# Patient Record
Sex: Male | Born: 2000
Health system: Southern US, Community
[De-identification: ages and names within clinical notes are randomized; demographics above are authoritative.]

## PROBLEM LIST (undated history)

## (undated) DIAGNOSIS — J302 Other seasonal allergic rhinitis: Secondary | ICD-10-CM

## (undated) DIAGNOSIS — F411 Generalized anxiety disorder: Secondary | ICD-10-CM

## (undated) DIAGNOSIS — H9325 Central auditory processing disorder: Secondary | ICD-10-CM

## (undated) DIAGNOSIS — J45909 Unspecified asthma, uncomplicated: Secondary | ICD-10-CM

## (undated) HISTORY — DX: Unspecified asthma, uncomplicated: J45.909

---
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
## 2021-02-28 ENCOUNTER — Other Ambulatory Visit: Payer: Self-pay | Admitting: Sports Medicine

## 2021-02-28 ENCOUNTER — Encounter: Payer: Self-pay | Admitting: Sports Medicine

## 2021-02-28 ENCOUNTER — Other Ambulatory Visit (HOSPITAL_BASED_OUTPATIENT_CLINIC_OR_DEPARTMENT_OTHER): Payer: Self-pay

## 2021-02-28 DIAGNOSIS — F411 Generalized anxiety disorder: Secondary | ICD-10-CM

## 2021-02-28 MED ORDER — ESCITALOPRAM OXALATE 20 MG PO TABS
20.0000 mg | ORAL_TABLET | Freq: Every day | ORAL | 3 refills | Status: DC
  Filled 2021-02-28: qty 90, 90d supply, fill #0
  Filled 2021-06-20: qty 90, 90d supply, fill #1

## 2021-03-04 ENCOUNTER — Other Ambulatory Visit (HOSPITAL_BASED_OUTPATIENT_CLINIC_OR_DEPARTMENT_OTHER): Payer: Self-pay

## 2021-05-27 DIAGNOSIS — H52223 Regular astigmatism, bilateral: Secondary | ICD-10-CM | POA: Diagnosis not present

## 2021-05-27 DIAGNOSIS — H5213 Myopia, bilateral: Secondary | ICD-10-CM | POA: Diagnosis not present

## 2021-05-27 DIAGNOSIS — Z135 Encounter for screening for eye and ear disorders: Secondary | ICD-10-CM | POA: Diagnosis not present

## 2021-05-27 DIAGNOSIS — H1045 Other chronic allergic conjunctivitis: Secondary | ICD-10-CM | POA: Diagnosis not present

## 2021-06-20 ENCOUNTER — Other Ambulatory Visit (HOSPITAL_BASED_OUTPATIENT_CLINIC_OR_DEPARTMENT_OTHER): Payer: Self-pay

## 2021-06-24 ENCOUNTER — Ambulatory Visit: Payer: 59 | Admitting: Family Medicine

## 2021-06-24 ENCOUNTER — Encounter: Payer: Self-pay | Admitting: Family Medicine

## 2021-06-24 VITALS — BP 127/76 | HR 73 | Temp 98.2°F | Ht 70.0 in | Wt 173.2 lb

## 2021-06-24 DIAGNOSIS — Z7689 Persons encountering health services in other specified circumstances: Secondary | ICD-10-CM | POA: Diagnosis not present

## 2021-06-24 DIAGNOSIS — F411 Generalized anxiety disorder: Secondary | ICD-10-CM

## 2021-06-24 DIAGNOSIS — F502 Bulimia nervosa, unspecified: Secondary | ICD-10-CM

## 2021-06-24 MED ORDER — ESCITALOPRAM OXALATE 20 MG PO TABS: 10.0000 mg | ORAL_TABLET | Freq: Every day | ORAL | 3 refills | Status: DC

## 2021-06-24 MED ORDER — FLUOXETINE HCL 10 MG PO CAPS: 10.0000 mg | ORAL_CAPSULE | Freq: Every day | ORAL | 1 refills | Status: DC

## 2021-06-24 NOTE — Progress Notes (Signed)
? ?Subjective: ?[id number] care, anxiety and eating disorder ?HPI: ?Charles Rios is a 21 y.o. male presenting to clinic today for: ? ?1.  Anxiety disorder with associated bulimia, purging type ?Patient reports that he started having purging behaviors when he was in the ninth grade.  He had some anxiety but really did not understand that the condition he was suffering from.  Purging behaviors were very rare in the ninth grade but in the 10th grade he went through a very bad break-up and he noticed that those behaviors started becoming more frequent in fact he was anorexic for about 2 weeks during that time.  He recalls having been "grounded" because he had these behaviors and this caused him to feel excessive guilt and embarrassment.  In fact, he admits he is embarrassed to talk about this today but knows that he needs to seek help. ? ?He often finds himself purging after "unhealthy foods".  He identifies these as things like [employer] or other fast foods.  He feels restricted from hanging out with his friends and going out and doing normal things that people his age do because of the constant need to purge.  He has been purging on a daily basis for about 1-1/2 years now.  He has mentioned this previously to his previous PCP and had labs drawn last November but everything was normal.  He has not really felt comfortable talking about it much to anyone else.  He is very interested in seeking counseling services and would like to get that arranged today if possible.  He will be attending school in [place] and ideally would like to have a specialist down there if possible.  He has been on Lexapro for a while and has tried to self taper but notes that he had some adverse side effects of increased anxiety so he went back on the full dose of Lexapro.  He is very open to alternative treatment plan however. ? ?Past Medical History:  ?Diagnosis Date  ? Central auditory processing disorder (CAPD)   ? Generalized  anxiety disorder   ? Seasonal allergies   ? ?History reviewed. No pertinent surgical history. ?Social History  ? ?Socioeconomic History  ? Marital status: Single  ?  Spouse name: Not on file  ? Number of children: Not on file  ? Years of education: Not on file  ? Highest education level: Not on file  ?Occupational History  ? Not on file  ?Tobacco Use  ? Smoking status: Never  ? Smokeless tobacco: Never  ?Vaping Use  ? Vaping Use: Never used  ?Substance and Sexual Activity  ? Alcohol use: No  ? Drug use: No  ? Sexual activity: Never  ?Other Topics Concern  ? Not on file  ?Social History Narrative  ? Not on file  ? ?Social Determinants of Health  ? ?Financial Resource Strain: Not on file  ?Food Insecurity: Not on file  ?Transportation Needs: Not on file  ?Physical Activity: Not on file  ?Stress: Not on file  ?Social Connections: Not on file  ?Intimate Partner Violence: Not on file  ? ?Current Meds  ?Medication Sig  ? FLUoxetine (PROZAC) 10 MG capsule Take 1 capsule (10 mg total) by mouth daily.  ? [DISCONTINUED] cloNIDine (CATAPRES) 0.1 MG tablet Take 1 tablet (0.1 mg total) by mouth 2 (two) times daily.  ? [DISCONTINUED] escitalopram (LEXAPRO) 20 MG tablet Take 1 tablet (20 mg total) by mouth daily.  ? [DISCONTINUED] meloxicam (MOBIC) 15 MG tablet One tab PO  qAM with a meal for 2 weeks, then daily prn pain.  ? ?Family History  ?Problem Relation Age of Onset  ? Healthy Mother   ? Healthy Father   ? ?Allergies  ?Allergen Reactions  ? Other Anaphylaxis  ?  Tree Nut allergy  ? Peanuts [Peanut Oil]   ?  Per mom pt has a  nut allergy   ? ?ROS: Per HPI ? ?Objective: ?Office vital signs reviewed. ?BP 127/76   Pulse 73   Temp 98.2 ?F (36.8 ?C)   Ht '5\' 10"'$  (1.778 m)   Wt 173 lb 3.2 oz (78.6 kg)   SpO2 97%   BMI 24.85 kg/m?  ? ?Physical Examination:  ?General: Awake, alert, well-appearing male, No acute distress ?HEENT: Sclera white.  Moist mucous membranes ?Cardio: regular rate and rhythm  ?Pulm: Normal work of breathing  on room air ?Psych: Mood is stable.  Patient is slightly anxious when discussing his disorder but very pleasant, interactive.  His thought process is linear.  Good eye insight.  Good eye contact. ? ? ?  06/24/2021  ? 11:06 AM 08/07/2019  ?  9:10 AM 04/13/2018  ?  9:49 AM  ?Depression screen PHQ 2/9  ?Decreased Interest 0 3 1  ?Down, Depressed, Hopeless '1 1 1  '$ ?PHQ - 2 Score '1 4 2  '$ ?Altered sleeping '2 2 2  '$ ?Tired, decreased energy 1 1 0  ?Change in appetite '3 1 1  '$ ?Feeling bad or failure about yourself  0 2 1  ?Trouble concentrating 0 3 0  ?Moving slowly or fidgety/restless 1 0 0  ?Suicidal thoughts 0 0 0  ?PHQ-9 Score '8 13 6  '$ ?Difficult doing work/chores Not difficult at all Very difficult Somewhat difficult  ? ? ?  06/24/2021  ? 11:06 AM 08/07/2019  ?  9:12 AM 04/13/2018  ?  9:50 AM 02/28/2018  ?  9:00 AM  ?GAD 7 : Generalized Anxiety Score  ?Nervous, Anxious, on Edge '1 3 1 1  '$ ?Control/stop worrying '2 3 2 3  '$ ?Worry too much - different things '1 2 1 3  '$ ?Trouble relaxing 0 1 0 2  ?Restless '1 2 1 1  '$ ?Easily annoyed or irritable '1 2 1 1  '$ ?Afraid - awful might happen 0 '1 1 1  '$ ?Total GAD 7 Score '6 14 7 12  '$ ?Anxiety Difficulty Somewhat difficult Very difficult Not difficult at all Very difficult  ? ? ?Assessment/ Plan: ?21 y.o. male  ? ?Bulimia nervosa, purging type - Plan: FLUoxetine (PROZAC) 10 MG capsule, Ambulatory referral to Psychology ? ?Establishing care with new doctor, encounter for ? ?Generalized anxiety disorder - Plan: escitalopram (LEXAPRO) 20 MG tablet, Ambulatory referral to Psychology ? ?He certainly sounds like he is suffering from bulimia nerve use and this seems to be related to may be an uncontrolled generalized anxiety disorder.  Since Lexapro has really not been efficacious for him I am going to taper him off of this and put him on Prozac instead.  He will go down to 10 mg of Lexapro daily for the next 2 weeks and cross taper with Prozac 10 mg daily for the next 2 weeks.  We have set up a telephone visit to  reconvene in 2 weeks and at that time I will advance to 20 mg daily depending on tolerance of the Prozac.  I certainly considered addition of Abilify as well.  I am referring to counseling services in [place] and less we can find somebody that is virtual in [place]  so that he can get started sooner.  May need to consider addition of referral to psychiatry if we are not successful with our treatment plan. ? ?Total time spent with patient 32 minutes.  Greater than 50% of encounter spent in coordination of care/counseling. ? ? ?Janora Norlander, DO ?[place] ?([phone] ? ? ? ?

## 2021-06-24 NOTE — Patient Instructions (Addendum)
Cut Lexapro in half for 14 days.  Then STOP medication. ?Immediately START Fluoxetine '10mg'$  daily ?You will be called to schedule counselor ? ?Taking the medicine as directed and not missing any doses is one of the best things you can do to treat your condition.  Here are some things to keep in mind: ? ?Side effects (stomach upset, some increased anxiety) may happen before you notice a benefit.  These side effects typically go away over time. ?Changes to your dose of medicine or a change in medication all together is sometimes necessary ?Most people need to be on medication at least 12 months ?Many people will notice an improvement within two weeks but the full effect of the medication can take up to 4-6 weeks ?Stopping the medication when you start feeling better often results in a return of symptoms ?Never discontinue your medication without contacting a health care professional first.  Some medications require gradual discontinuation/ taper and can make you sick if you stop them abruptly. ? ?If your symptoms worsen or you have thoughts of suicide/homicide, PLEASE SEEK IMMEDIATE MEDICAL ATTENTION.  You may always call: ? ?National Suicide Hotline: (503) 688-4622 ?[place]: [phone] ?[facility] in [place]: [phone] ? ? ?These are available 24 hours a day, 7 days a week. ? ? ?

## 2021-07-08 ENCOUNTER — Telehealth (INDEPENDENT_AMBULATORY_CARE_PROVIDER_SITE_OTHER): Payer: 59 | Admitting: Family Medicine

## 2021-07-08 DIAGNOSIS — F502 Bulimia nervosa: Secondary | ICD-10-CM

## 2021-07-08 MED ORDER — FLUOXETINE HCL 20 MG PO CAPS
20.0000 mg | ORAL_CAPSULE | Freq: Every day | ORAL | 0 refills | Status: DC
Start: 2021-07-08 — End: 2021-08-26

## 2021-07-08 NOTE — Progress Notes (Signed)
MyChart Video visit  Subjective: [name]  PCP: Janora Norlander, DO [name] Charles Rios is a 21 y.o. male. Patient provides verbal consent for consult held via video.  Due to COVID-19 pandemic this visit was conducted virtually. This visit type was conducted due to national recommendations for restrictions regarding the COVID-19 Pandemic (e.g. social distancing, sheltering in place) in an effort to limit this patient's exposure and mitigate transmission in our community. All issues noted in this document were discussed and addressed.  A physical exam was not performed with this format.   Location of patient: work Location of provider: [facility] Others present for call: none  1.  Bulimia, purging type Patient was seen about 2 weeks ago.  At that time his Lexapro was reduced to 10 mg and he was started on 10 mg of Prozac.  Patient has felt like something is changing. He has not noticed necessarily any reduction in frequency of behaviors but does feel more rational when purging urges hit.  He feels optimistic about how things are going with the new medication and does not report any concerning side effects or symptoms.  ROS: Per HPI  Allergies  Allergen Reactions   Other Anaphylaxis    Tree Nut allergy   Peanuts [Peanut Oil]     Per mom pt has a  nut allergy    Past Medical History:  Diagnosis Date   Central auditory processing disorder (CAPD)    Generalized anxiety disorder    Seasonal allergies     Current Outpatient Medications:    escitalopram (LEXAPRO) 20 MG tablet, Take 0.5 tablets (10 mg total) by mouth daily., Disp: 90 tablet, Rfl: 3   FLUoxetine (PROZAC) 10 MG capsule, Take 1 capsule (10 mg total) by mouth daily., Disp: 30 capsule, Rfl: 1  Gen: well appearing male, NAD Psych: good eye contact. Mood stable.  Assessment/ Plan: 21 y.o. male   Bulimia nervosa, purging type - Plan: FLUoxetine (PROZAC) 20 MG capsule  Seems to be doing slightly better though continues to  have purging behaviors.  Given how early it is in the process of medication changes I am optimistic about how he will in fact respond to therapeutic doses of Prozac.  I have gone ahead and advanced him to 20 mg and he will totally discontinue Lexapro.  We discussed contacting me if any concerning symptoms or side effects occur prior to our next visit which is scheduled for July 18.  He has no questions or concerns at this time and feels positive about the current plan.  Start time: 11:03am End time: 11:09a  Total time spent on patient care (including video visit/ documentation): 6 minutes  [name], [place] [phone]

## 2021-08-26 ENCOUNTER — Ambulatory Visit: Payer: 59 | Admitting: Family Medicine

## 2021-08-26 ENCOUNTER — Encounter: Payer: Self-pay | Admitting: Family Medicine

## 2021-08-26 VITALS — BP 135/84 | HR 85 | Temp 97.3°F | Ht 70.0 in | Wt 167.2 lb

## 2021-08-26 DIAGNOSIS — F411 Generalized anxiety disorder: Secondary | ICD-10-CM

## 2021-08-26 DIAGNOSIS — F502 Bulimia nervosa: Secondary | ICD-10-CM

## 2021-08-26 MED ORDER — FLUOXETINE HCL 40 MG PO CAPS: 40.0000 mg | ORAL_CAPSULE | Freq: Every day | ORAL | 0 refills | Status: DC

## 2021-08-26 NOTE — Progress Notes (Signed)
Subjective: CC: Follow-up bulimia, anxiety PCP: Janora Norlander, DO SFK:Charles Rios is a 21 y.o. male presenting to clinic today for:  1.  Bulimia, anxiety Patient reports overall anxiety seems to be better but the bulimia is still at the same.  He still is triggered by "foods that are not good for him" needing to get out of his body.  He just wants to enjoy a hamburger without feel like he needs to regurgitate it.  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  He is going to have an apartment off campus.  He is currently in the process of freshening that.  School does not start though until mid August.   ROS: Per HPI  Allergies  Allergen Reactions   Other Anaphylaxis    Tree Nut allergy   Peanuts [Peanut Oil]     Per mom pt has a  nut allergy    Past Medical History:  Diagnosis Date   Central auditory processing disorder (CAPD)    Generalized anxiety disorder    Seasonal allergies     Current Outpatient Medications:    FLUoxetine (PROZAC) 20 MG capsule, Take 1 capsule (20 mg total) by mouth daily., Disp: 90 capsule, Rfl: 0 Social History   Socioeconomic History   Marital status: Single    Spouse name: Not on file   Number of children: Not on file   Years of education: Not on file   Highest education level: Not on file  Occupational History   Not on file  Tobacco Use   Smoking status: Never   Smokeless tobacco: Never  Vaping Use   Vaping Use: Never used  Substance and Sexual Activity   Alcohol use: No   Drug use: No   Sexual activity: Never  Other Topics Concern   Not on file  Social History Narrative   Enrolled at [employer]   Father works for [employer] in [place], [place]   Social Determinants of Health   Financial Resource Strain: Not on [employer] Insecurity: Not on file  Transportation Needs: Not on file  Physical Activity: Not on file  Stress: Not on file  Social  Connections: Not on file  Intimate Partner Violence: Not on file   Family History  Problem Relation Age of Onset   Healthy Mother    Healthy Father     Objective: Office vital signs reviewed. BP 135/84   Pulse 85   Temp (!) 97.3 F (36.3 C)   Ht '5\' 10"'$  (1.778 m)   Wt 167 lb 3.2 oz (75.8 kg)   SpO2 99%   BMI 23.99 kg/m   Physical Examination:  General: Awake, alert, well nourished, No acute distress HEENT:sclera white, MMM Cardio: regular rate and rhythm, S1S2 heard, no murmurs appreciated Pulm: clear to auscultation bilaterally, no wheezes, rhonchi or rales; normal work of breathing on room air Psych: Mood stable, speech normal, affect appropriate.  Very pleasant, interactive.  Good eye contact     08/26/2021   10:04 AM 06/24/2021   11:06 AM 08/07/2019    9:10 AM  Depression screen PHQ 2/9  Decreased Interest 0 0 3  Down, Depressed, Hopeless '1 1 1  '$ PHQ - 2 Score '1 1 4  '$ Altered sleeping '1 2 2  '$ Tired, decreased energy '1 1 1  '$ Change in appetite '2 3 1  '$ Feeling bad or failure about yourself  0 0 2  Trouble concentrating 2 0 3  Moving slowly or fidgety/restless 2 1 0  Suicidal thoughts 0 0 0  PHQ-9 Score '9 8 13  '$ Difficult doing work/chores Somewhat difficult Not difficult at all Very difficult      08/26/2021   10:04 AM 06/24/2021   11:06 AM 08/07/2019    9:12 AM 04/13/2018    9:50 AM  GAD 7 : Generalized Anxiety Score  Nervous, Anxious, on Edge '2 1 3 1  '$ Control/stop worrying '1 2 3 2  '$ Worry too much - different things '1 1 2 1  '$ Trouble relaxing 0 0 1 0  Restless '3 1 2 1  '$ Easily annoyed or irritable '1 1 2 1  '$ Afraid - awful might happen 0 0 1 1  Total GAD 7 Score '8 6 14 7  '$ Anxiety Difficulty Somewhat difficult Somewhat difficult Very difficult Not difficult at all    Assessment/ Plan: 21 y.o. male   Bulimia nervosa, purging type - Plan: FLUoxetine (PROZAC) 40 MG capsule  Generalized anxiety disorder  Has had about a 6 pound weight loss since her last visit.   Subjectively anxiety symptoms are improving.  Advance Prozac to 40 mg daily.  He has been given information for the counselor that we referred him to back in May.  Advised him to contact me if he needs any referral for any reason or if the Prozac 40 mg is ineffective.  Set up a video visit for 6 to 8 weeks for recheck  No orders of the defined types were placed in this encounter.  No orders of the defined types were placed in this encounter.    Janora Norlander, DO [place] [phone]

## 2021-09-17 ENCOUNTER — Encounter: Payer: Self-pay | Admitting: Family Medicine

## 2021-10-12 ENCOUNTER — Encounter: Payer: Self-pay | Admitting: Family Medicine

## 2021-10-17 ENCOUNTER — Other Ambulatory Visit: Payer: Self-pay | Admitting: Family Medicine

## 2021-10-17 DIAGNOSIS — F502 Bulimia nervosa: Secondary | ICD-10-CM

## 2021-10-20 ENCOUNTER — Encounter: Payer: Self-pay | Admitting: Family Medicine

## 2021-10-20 ENCOUNTER — Telehealth (INDEPENDENT_AMBULATORY_CARE_PROVIDER_SITE_OTHER): Payer: 59 | Admitting: Family Medicine

## 2021-10-20 DIAGNOSIS — F411 Generalized anxiety disorder: Secondary | ICD-10-CM | POA: Diagnosis not present

## 2021-10-20 DIAGNOSIS — F502 Bulimia nervosa: Secondary | ICD-10-CM | POA: Diagnosis not present

## 2021-10-20 MED ORDER — FLUOXETINE HCL 40 MG PO CAPS: ORAL_CAPSULE | ORAL | 3 refills | Status: DC

## 2021-10-20 NOTE — Progress Notes (Signed)
MyChart Video visit  Subjective: WG:NFAO PCP: Janora Norlander, DO ZHY:QMVHQIO [place] is a 21 y.o. male. Patient provides verbal consent for consult held via video.  Due to COVID-19 pandemic this visit was conducted virtually. This visit type was conducted due to national recommendations for restrictions regarding the COVID-19 Pandemic (e.g. social distancing, sheltering in place) in an effort to limit this patient's exposure and mitigate transmission in our community. All issues noted in this document were discussed and addressed.  A physical exam was not performed with this format.   Location of patient: school in [place] Location of provider: [facility] Others present for call: none  1. Anxiety/ Bulimia  Patient feeling somewhat better on the increased dose.  He has yet to establish care with counseling services.  He's been busy with school. He has been able to control his binging/purging a little better.  He still engages in purging a few times per week but no longer multiple times per day. Anxiety is well controlled.  He feels like he is thriving at school.  Not having any issues from a social standpoint.  Has adjusted to his schedule now.  ROS: Per HPI  Allergies  Allergen Reactions   Other Anaphylaxis    Tree Nut allergy   Peanuts [Peanut Oil]     Per mom pt has a  nut allergy    Past Medical History:  Diagnosis Date   Central auditory processing disorder (CAPD)    Generalized anxiety disorder    Seasonal allergies     Current Outpatient Medications:    FLUoxetine (PROZAC) 40 MG capsule, TAKE 1 CAPSULE(40 MG) BY MOUTH DAILY, Disp: 90 capsule, Rfl: 0  Gen: Well-appearing, well-nourished male Psych: Mood able, speech normal, affect appropriate.  Pleasant, interactive.  Smiling  Assessment/ Plan: 21 y.o. male   Bulimia nervosa, purging type - Plan: FLUoxetine (PROZAC) 40 MG capsule  Generalized anxiety disorder - Plan: FLUoxetine (PROZAC) 40 MG capsule  Bulimia  still present but frequency decreasing.  This is promising.  Still think that adding counseling/therapy is going to be essential in reducing/eliminating these behaviors.  He is in the action phase of getting this scheduled and did not need the number to the office today.  We will continue Prozac at 40 mg daily as his anxiety is under excellent control.  Would like to see him back sometime at the end of December beginning of January for check-in, sooner if concerns arise  Start time: 3:45pm (first text sent); 3:49pm (second text sent) End time: 3:57pm  Total time spent on patient care (including video visit/ documentation): 7 minutes  [place], [place] [phone]

## 2021-11-21 ENCOUNTER — Other Ambulatory Visit (HOSPITAL_BASED_OUTPATIENT_CLINIC_OR_DEPARTMENT_OTHER): Payer: Self-pay

## 2021-11-21 ENCOUNTER — Other Ambulatory Visit: Payer: Self-pay | Admitting: Family Medicine

## 2021-11-21 DIAGNOSIS — F411 Generalized anxiety disorder: Secondary | ICD-10-CM

## 2021-11-21 DIAGNOSIS — F502 Bulimia nervosa: Secondary | ICD-10-CM

## 2021-11-21 MED ORDER — FLUOXETINE HCL 40 MG PO CAPS
40.0000 mg | ORAL_CAPSULE | Freq: Every day | ORAL | 0 refills | Status: DC
  Filled 2021-11-21: qty 60, 60d supply, fill #0

## 2022-02-05 ENCOUNTER — Other Ambulatory Visit: Payer: Self-pay | Admitting: Family Medicine

## 2022-02-05 ENCOUNTER — Other Ambulatory Visit (HOSPITAL_BASED_OUTPATIENT_CLINIC_OR_DEPARTMENT_OTHER): Payer: Self-pay

## 2022-02-05 ENCOUNTER — Encounter: Payer: Self-pay | Admitting: Family Medicine

## 2022-02-05 DIAGNOSIS — F502 Bulimia nervosa: Secondary | ICD-10-CM

## 2022-02-05 DIAGNOSIS — F411 Generalized anxiety disorder: Secondary | ICD-10-CM

## 2022-02-05 MED ORDER — FLUOXETINE HCL 40 MG PO CAPS
40.0000 mg | ORAL_CAPSULE | Freq: Every day | ORAL | 0 refills | Status: DC
  Filled 2022-02-05: qty 90, 90d supply, fill #0

## 2022-03-03 ENCOUNTER — Encounter: Payer: Self-pay | Admitting: Family Medicine

## 2022-03-03 NOTE — Telephone Encounter (Signed)
[place], I have a 315 acute slot open Monday.  If we have a sooner cancellation, I'm glad to accommodate but I think we're double booked already this week.

## 2022-03-09 ENCOUNTER — Telehealth (INDEPENDENT_AMBULATORY_CARE_PROVIDER_SITE_OTHER): Payer: Commercial Managed Care - PPO | Admitting: Family Medicine

## 2022-03-09 ENCOUNTER — Encounter: Payer: Self-pay | Admitting: Family Medicine

## 2022-03-09 DIAGNOSIS — F411 Generalized anxiety disorder: Secondary | ICD-10-CM

## 2022-03-09 DIAGNOSIS — F502 Bulimia nervosa: Secondary | ICD-10-CM

## 2022-03-09 MED ORDER — FLUOXETINE HCL 40 MG PO CAPS: 40.0000 mg | ORAL_CAPSULE | Freq: Every day | ORAL | 3 refills | Status: DC
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
## 2022-03-26 DIAGNOSIS — F5089 Other specified eating disorder: Secondary | ICD-10-CM | POA: Diagnosis not present

## 2022-04-03 IMAGING — DX DG SHOULDER 2+V*R*
3 series · 3 of 3 positions shown · non-contrast
Comparison: None.

CLINICAL DATA: Shoulder pain

EXAM:
RIGHT SHOULDER - 2+ VIEW

[shoulder grashey]
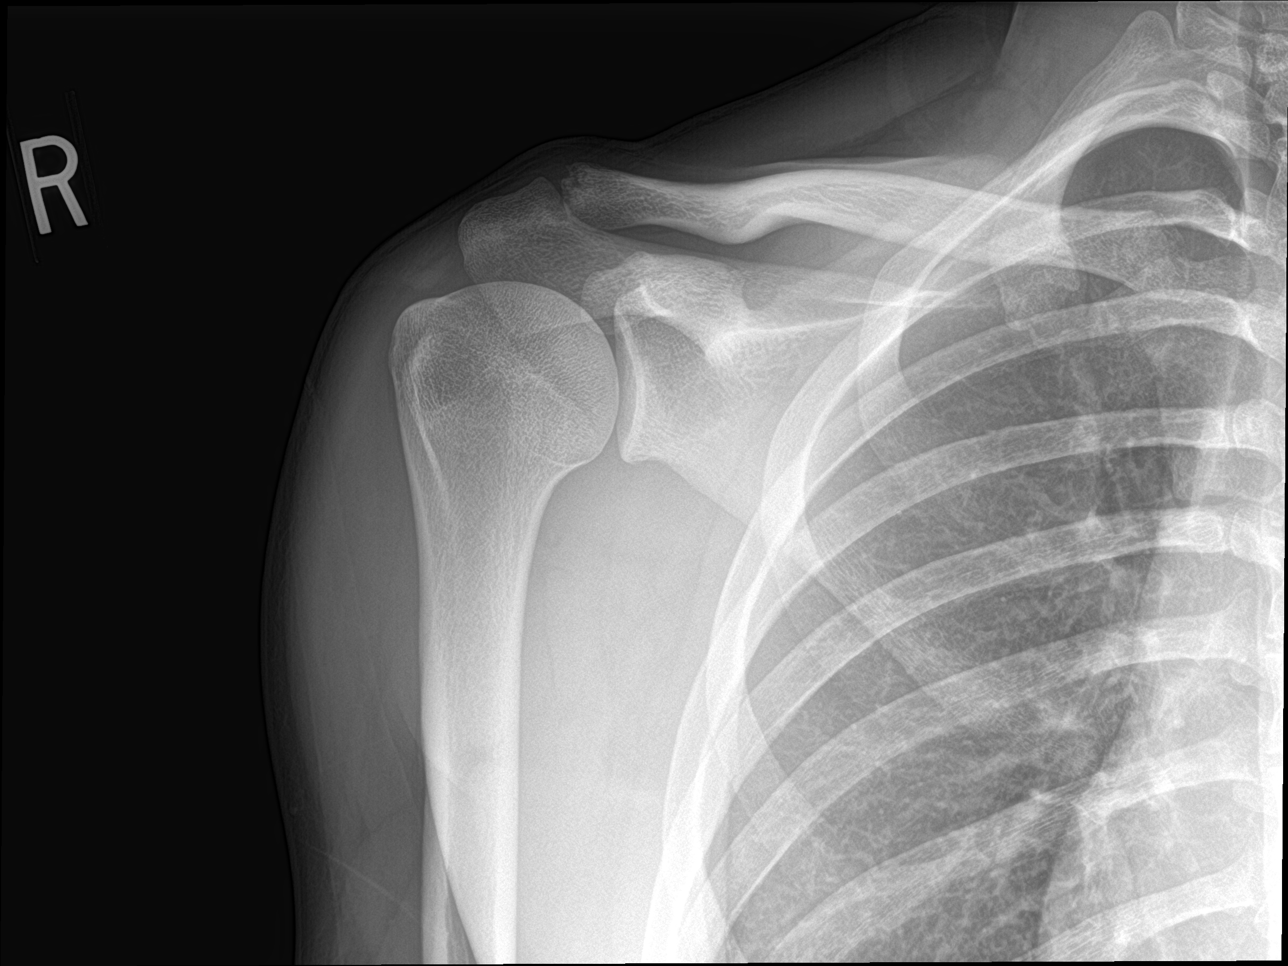

[shoulder y view]
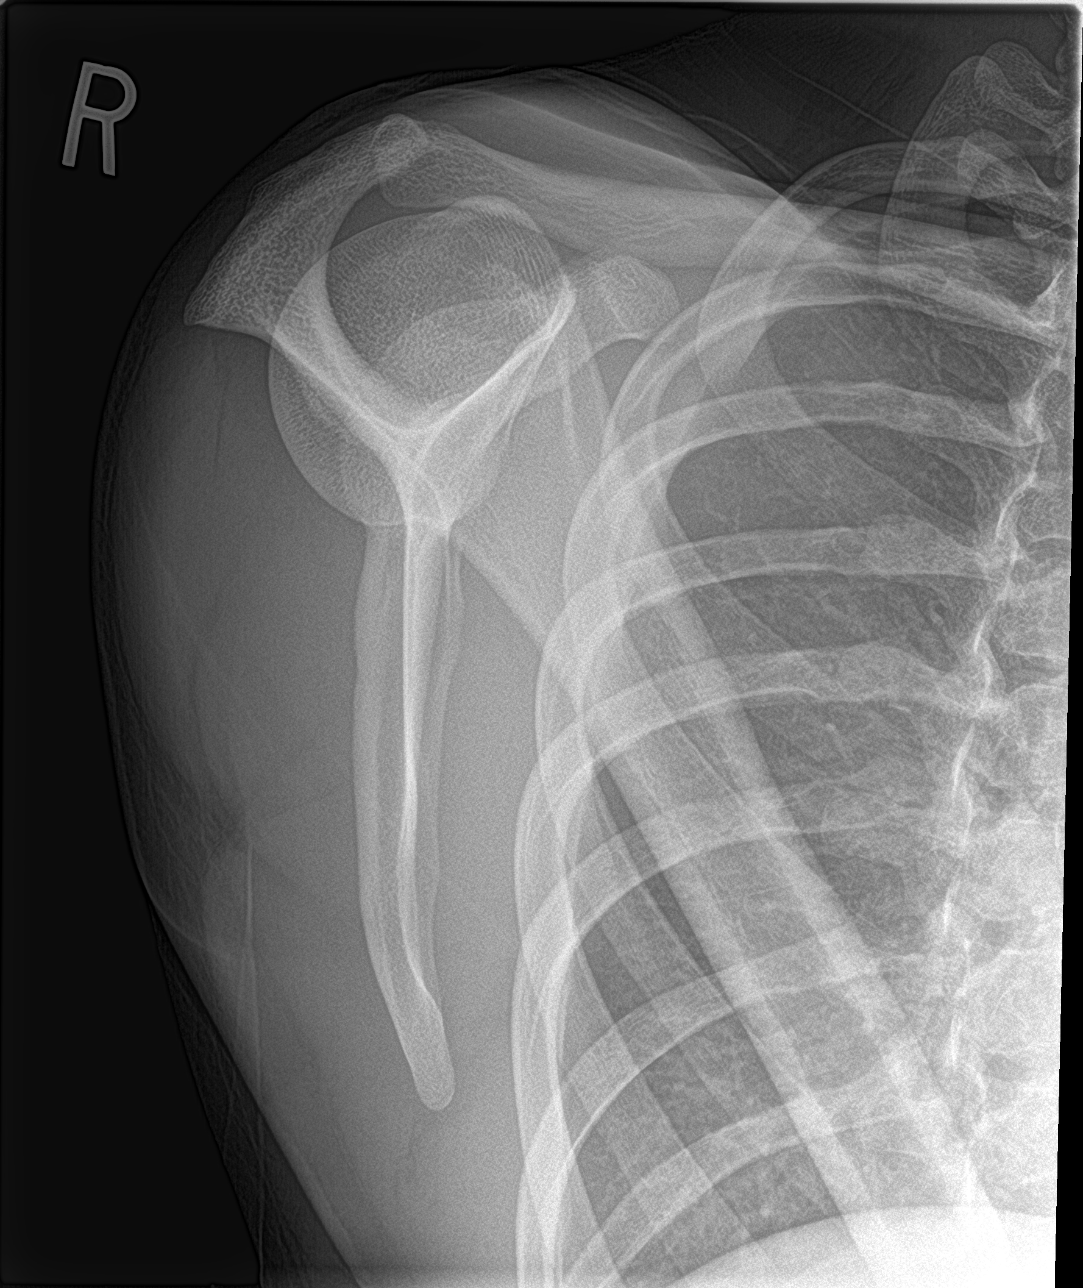

[shoulder axillary]
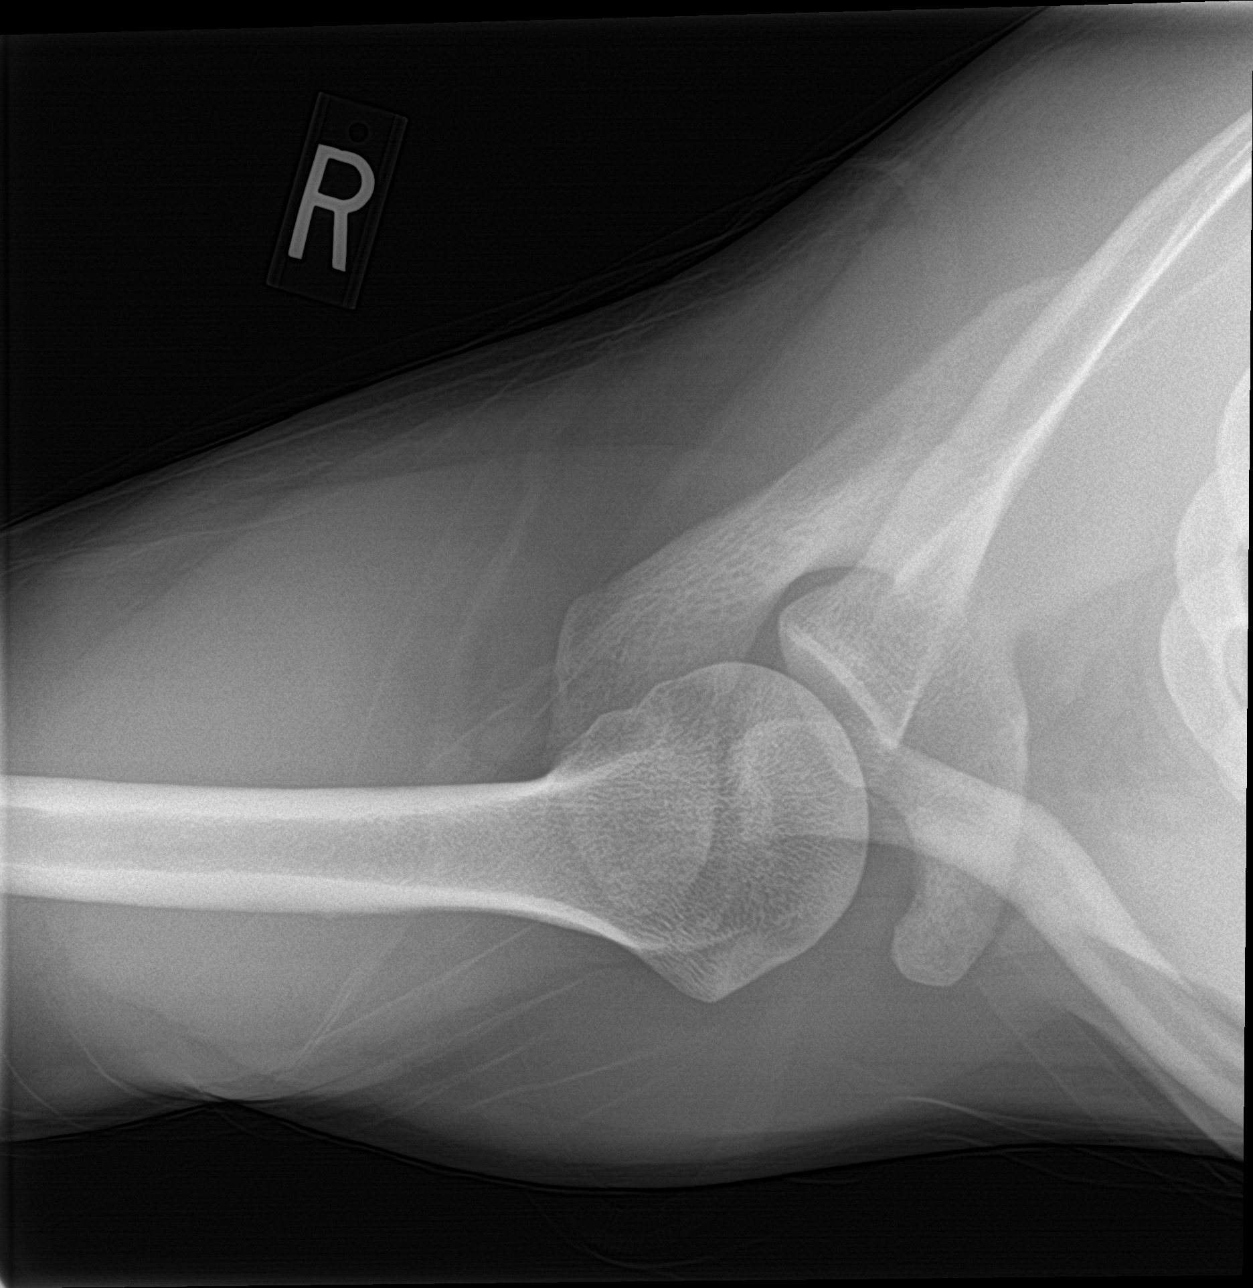

[3 of 3 positions shown; findings below may reference images not displayed]

FINDINGS: No fracture or malalignment. Minimal osteolytic changes at the
articular surface of the distal right clavicle. No significant AC
joint widening. Right lung apex is clear
IMPRESSION: Minimal osteolytic changes at the articular surface of the distal
right clavicle without significant AC joint widening. Otherwise
negative shoulder radiograph

## 2022-04-09 DIAGNOSIS — Z713 Dietary counseling and surveillance: Secondary | ICD-10-CM | POA: Diagnosis not present

## 2022-04-09 DIAGNOSIS — F5089 Other specified eating disorder: Secondary | ICD-10-CM | POA: Diagnosis not present

## 2022-04-10 DIAGNOSIS — F901 Attention-deficit hyperactivity disorder, predominantly hyperactive type: Secondary | ICD-10-CM | POA: Diagnosis not present

## 2022-04-13 DIAGNOSIS — F901 Attention-deficit hyperactivity disorder, predominantly hyperactive type: Secondary | ICD-10-CM | POA: Diagnosis not present

## 2022-04-24 DIAGNOSIS — F901 Attention-deficit hyperactivity disorder, predominantly hyperactive type: Secondary | ICD-10-CM | POA: Diagnosis not present

## 2022-04-28 DIAGNOSIS — Z713 Dietary counseling and surveillance: Secondary | ICD-10-CM | POA: Diagnosis not present

## 2022-04-28 DIAGNOSIS — F5089 Other specified eating disorder: Secondary | ICD-10-CM | POA: Diagnosis not present

## 2022-05-01 DIAGNOSIS — F901 Attention-deficit hyperactivity disorder, predominantly hyperactive type: Secondary | ICD-10-CM | POA: Diagnosis not present

## 2022-05-06 DIAGNOSIS — F5089 Other specified eating disorder: Secondary | ICD-10-CM | POA: Diagnosis not present

## 2022-05-06 DIAGNOSIS — Z713 Dietary counseling and surveillance: Secondary | ICD-10-CM | POA: Diagnosis not present

## 2022-05-12 ENCOUNTER — Encounter: Payer: Self-pay | Admitting: Family Medicine

## 2022-05-20 ENCOUNTER — Other Ambulatory Visit: Payer: Self-pay | Admitting: Family Medicine

## 2022-05-20 DIAGNOSIS — F411 Generalized anxiety disorder: Secondary | ICD-10-CM

## 2022-05-20 DIAGNOSIS — F502 Bulimia nervosa: Secondary | ICD-10-CM

## 2022-05-20 MED ORDER — FLUOXETINE HCL 40 MG PO CAPS: 40.0000 mg | ORAL_CAPSULE | Freq: Every day | ORAL | 3 refills | Status: DC

## 2022-05-21 DIAGNOSIS — F5089 Other specified eating disorder: Secondary | ICD-10-CM | POA: Diagnosis not present

## 2022-05-21 DIAGNOSIS — Z713 Dietary counseling and surveillance: Secondary | ICD-10-CM | POA: Diagnosis not present

## 2022-05-22 DIAGNOSIS — F901 Attention-deficit hyperactivity disorder, predominantly hyperactive type: Secondary | ICD-10-CM | POA: Diagnosis not present

## 2022-06-09 DIAGNOSIS — F901 Attention-deficit hyperactivity disorder, predominantly hyperactive type: Secondary | ICD-10-CM | POA: Diagnosis not present

## 2022-07-22 DIAGNOSIS — F901 Attention-deficit hyperactivity disorder, predominantly hyperactive type: Secondary | ICD-10-CM | POA: Diagnosis not present

## 2022-08-05 DIAGNOSIS — F901 Attention-deficit hyperactivity disorder, predominantly hyperactive type: Secondary | ICD-10-CM | POA: Diagnosis not present

## 2022-08-21 DIAGNOSIS — F901 Attention-deficit hyperactivity disorder, predominantly hyperactive type: Secondary | ICD-10-CM | POA: Diagnosis not present

## 2022-10-09 DIAGNOSIS — F901 Attention-deficit hyperactivity disorder, predominantly hyperactive type: Secondary | ICD-10-CM | POA: Diagnosis not present

## 2022-12-02 DIAGNOSIS — F901 Attention-deficit hyperactivity disorder, predominantly hyperactive type: Secondary | ICD-10-CM | POA: Diagnosis not present

## 2023-01-27 ENCOUNTER — Ambulatory Visit: Payer: Commercial Managed Care - PPO | Admitting: Family Medicine

## 2023-01-27 ENCOUNTER — Encounter: Payer: Self-pay | Admitting: Family Medicine

## 2023-01-27 VITALS — BP 133/75 | HR 78 | Temp 98.1°F | Ht 70.0 in | Wt 177.2 lb

## 2023-01-27 DIAGNOSIS — L600 Ingrowing nail: Secondary | ICD-10-CM

## 2023-01-27 MED ORDER — TRAMADOL HCL 50 MG PO TABS: 50.0000 mg | ORAL_TABLET | Freq: Four times a day (QID) | ORAL | 0 refills | Status: AC

## 2023-01-27 MED ORDER — DICLOFENAC SODIUM 75 MG PO TBEC
75.0000 mg | DELAYED_RELEASE_TABLET | Freq: Two times a day (BID) | ORAL | 0 refills | Status: DC
Start: 2023-01-27 — End: 2023-07-18

## 2023-01-27 MED ORDER — AMOXICILLIN-POT CLAVULANATE 875-125 MG PO TABS: 1.0000 | ORAL_TABLET | Freq: Two times a day (BID) | ORAL | 0 refills | Status: DC

## 2023-01-27 NOTE — Progress Notes (Signed)
Chief Complaint  Patient presents with   Ingrown Toenail    HPI  Patient presents today for painful swelling at the toenail of each great toe since cutting too close a few days ago. Not responding to home therapy, soaking, etc.  PMH: Smoking status noted ROS: Per HPI  Objective: BP 133/75   Pulse 78   Temp 98.1 F (36.7 C)   Ht 5\' 10"  (1.778 m)   Wt 177 lb 3.2 oz (80.4 kg)   SpO2 98%   BMI 25.43 kg/m  Gen: NAD, alert, cooperative with exam HEENT: NCAT Ext: ingrown nail with erythema and edema medially.  Neuro: Alert and oriented, No gross deficits  Assessment and plan:  1. Ingrown toenail     Meds ordered this encounter  Medications   amoxicillin-clavulanate (AUGMENTIN) 875-125 MG tablet    Sig: Take 1 tablet by mouth 2 (two) times daily. Take all of this medication    Dispense:  20 tablet    Refill:  0   traMADol (ULTRAM) 50 MG tablet    Sig: Take 1 tablet (50 mg total) by mouth 4 (four) times daily for 5 days. 1-2 tablets up to 4 times a day as needed for pain    Dispense:  20 tablet    Refill:  0   diclofenac (VOLTAREN) 75 MG EC tablet    Sig: Take 1 tablet (75 mg total) by mouth 2 (two) times daily. For muscle and  Joint pain    Dispense:  60 tablet    Refill:  0   Follow up as needed.  Mechele Claude, MD

## 2023-01-31 ENCOUNTER — Encounter: Payer: Self-pay | Admitting: Family Medicine

## 2023-05-18 ENCOUNTER — Encounter: Payer: Self-pay | Admitting: Family Medicine

## 2023-05-18 DIAGNOSIS — Z91010 Allergy to peanuts: Secondary | ICD-10-CM

## 2023-05-18 DIAGNOSIS — J302 Other seasonal allergic rhinitis: Secondary | ICD-10-CM

## 2023-06-21 ENCOUNTER — Encounter: Payer: Self-pay | Admitting: Allergy & Immunology

## 2023-06-21 ENCOUNTER — Ambulatory Visit: Admitting: Allergy & Immunology

## 2023-06-21 ENCOUNTER — Other Ambulatory Visit: Payer: Self-pay

## 2023-06-21 VITALS — BP 134/88 | HR 88 | Temp 98.1°F | Resp 19 | Ht 69.75 in | Wt 181.7 lb

## 2023-06-21 DIAGNOSIS — T7805XD Anaphylactic reaction due to tree nuts and seeds, subsequent encounter: Secondary | ICD-10-CM

## 2023-06-21 DIAGNOSIS — R053 Chronic cough: Secondary | ICD-10-CM

## 2023-06-21 DIAGNOSIS — J310 Chronic rhinitis: Secondary | ICD-10-CM

## 2023-06-21 NOTE — Patient Instructions (Addendum)
 1. Chronic rhinitis - Because of insurance stipulations, we cannot do skin testing on the same day as your first visit. - We are all working to fight this, but for now we need to do two separate visits.  - We will know more after we do testing at the next visit.  - The skin testing visit can be squeezed in at your convenience.  - Then we can make a more full plan to address all of your symptoms. - Be sure to stop your antihistamines for 3 days before this appointment.  - We will know more at that point once we have the results.  - Start Ryaltris one spray per nostril daily (sample provided).  2. Anaphylaxis due to tree nut - We will test for tree nuts. - We will hold off on EpiPen .  3. Chronic cough - Lung testing looks good today.  - There is no need for an inhaler at this point in time.   4. Return in about 1 week (around 06/28/2023) for SKIN TESTING (1-55 + TREE NUTS). You can have the follow up appointment with Dr. Idolina Maker or a Nurse Practicioner (our Nurse Practitioners are excellent and always have Physician oversight!).    Please inform us  of any Emergency Department visits, hospitalizations, or changes in symptoms. Call us  before going to the ED for breathing or allergy symptoms since we might be able to fit you in for a sick visit. Feel free to contact us  anytime with any questions, problems, or concerns.  It was a pleasure to meet you today!  Websites that have reliable patient information: 1. American Academy of Asthma, Allergy, and Immunology: www.aaaai.org 2. Food Allergy Research and Education (FARE): foodallergy.org 3. Mothers of Asthmatics: http://www.asthmacommunitynetwork.org 4. American College of Allergy, Asthma, and Immunology: www.acaai.org      "Like" us  on Facebook and Instagram for our latest updates!      A healthy democracy works best when [employer] participate! Make sure you are registered to vote! If you have moved or changed any of your contact  information, you will need to get this updated before voting! Scan the QR codes below to learn more!

## 2023-06-21 NOTE — Progress Notes (Signed)
 NEW PATIENT  Date of Service/Encounter:  06/21/23  Consult requested by: Charles Guerin, DO   Assessment:   Chronic rhinitis  Anaphylaxis due to tree nut  Chronic cough - normal spirometry and has never needed albuterol  History of bulmia   Plan/Recommendations:   1. Chronic rhinitis - Because of insurance stipulations, we cannot do skin testing on the same day as your first visit. - We are all working to fight this, but for now we need to do two separate visits.  - We will know more after we do testing at the next visit.  - The skin testing visit can be squeezed in at your convenience.  - Then we can make a more full plan to address all of your symptoms. - Be sure to stop your antihistamines for 3 days before this appointment.  - We will know more at that point once we have the results.  - Start Ryaltris one spray per nostril daily (sample provided).  2. Anaphylaxis due to tree nut - We will test for tree nuts. - We will hold off on EpiPen .  3. Chronic cough - Lung testing looks good today.  - There is no need for an inhaler at this point in time.   4. Return in about 1 week (around 06/28/2023) for SKIN TESTING (1-55 + TREE NUTS). You can have the follow up appointment with Dr. Idolina Maker or a Nurse Practicioner (our Nurse Practitioners are excellent and always have Physician oversight!).   This note in its entirety was forwarded to the Provider who requested this consultation.  Subjective:   Charles Rios is a 23 y.o. male presenting today for evaluation of  Chief Complaint  Patient presents with   Establish Care    Seasonal allergies-doing landscaping during the summer. Would like to start allergy shots. Food Allergies- Did have a tree nut allergies previously. Has had pecan recently with no reaction, unsure about the rest of the tree nut family.  Asthma-sometimes have feels like he has to take deep breath but not being able to be relieved.      Charles Rios has a history of the following: Patient Active Problem List   Diagnosis Date Noted   Severe tetrahydrocannabinol (THC) dependence (HCC) 12/11/2020   Single episode of elevated blood pressure 12/11/2020   Viral syndrome 12/08/2020   Injury of knee, right 09/09/2020   Bilateral distal clavicular osteolysis 01/03/2020   Suspected COVID-19 virus infection 11/08/2019   Benign nevus 12/16/2018   Annual physical exam 12/16/2018   Proteinuria 10/23/2016   Abdominal pain in child 10/22/2016   Non-intractable cyclical vomiting with nausea 12/24/2015   Generalized anxiety disorder 12/24/2015   Central auditory processing disorder 01/02/2014   Poor concentration 07/11/2013   Seasonal allergies 12/02/2012    History obtained from: chart review and patient.  Discussed the use of AI scribe software for clinical note transcription with the patient and/or guardian, who gave verbal consent to proceed.  Charles Rios was referred by Charles Guerin, DO.     Charles Rios is a 23 y.o. male presenting for an evaluation of environmental allergies and tree nut allergies.  Asthma/Respiratory Symptom History: He has a cough, but has not been using albuterol.  He has never been prescribed an albuterol inhaler or nebulizer.  Allergic Rhinitis Symptom History: He experiences severe seasonal allergies primarily in the fall and spring, coinciding with the falling of leaves and blooming of plants. His symptoms include itchy and swollen eyes and frequent sneezing,  which have been persistent throughout his life. 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 He has been using over-the-counter antihistamines such as Allegra, Zyrtec, and Claritin, rotating them monthly, but they no longer provide relief. He also uses [facility] brand allergy eye drops, which he finds effective, unlike previous prescription eye drops. He has not used  nasal sprays or albuterol inhalers.  Food Allergy Symptom History: He has a known tree nut allergy diagnosed at age 23, although he has not had a recent reaction. He avoids foods containing tree nuts and is cautious about potential exposure. He has an EpiPen  but needs a new one. He recently consumed pecans without a reaction and is interested in allergy testing to reassess his allergies.  He currently resides in [place] for the summer, working full-time in Aeronautical engineer, and plans to return to [employer] for his final year of college. His father is a physical therapist, and his mother is a Runner, broadcasting/film/video. He has a brother and sister who are twins, both 22 years old.  Otherwise, there is no history of other atopic diseases, including drug allergies, stinging insect allergies, or contact dermatitis. There is no significant infectious history. Vaccinations are up to date.    Past Medical History: Patient Active Problem List   Diagnosis Date Noted   Severe tetrahydrocannabinol (THC) dependence (HCC) 12/11/2020   Single episode of elevated blood pressure 12/11/2020   Viral syndrome 12/08/2020   Injury of knee, right 09/09/2020   Bilateral distal clavicular osteolysis 01/03/2020   Suspected COVID-19 virus infection 11/08/2019   Benign nevus 12/16/2018   Annual physical exam 12/16/2018   Proteinuria 10/23/2016   Abdominal pain in child 10/22/2016   Non-intractable cyclical vomiting with nausea 12/24/2015   Generalized anxiety disorder 12/24/2015   Central auditory processing disorder 01/02/2014   Poor concentration 07/11/2013   Seasonal allergies 12/02/2012    Medication List:  Allergies as of 06/21/2023       Reactions   Other Anaphylaxis   Tree Nut allergy   Peanuts [peanut Oil]    Per mom pt has a  nut allergy         Medication List        Accurate as of Jun 21, 2023  2:38 PM. If you have any questions, ask your nurse or doctor.          amoxicillin -clavulanate 875-125 MG  tablet Commonly known as: AUGMENTIN  Take 1 tablet by mouth 2 (two) times daily. Take all of this medication   diclofenac  75 MG EC tablet Commonly known as: VOLTAREN  Take 1 tablet (75 mg total) by mouth 2 (two) times daily. For muscle and  Joint pain   FLUoxetine  40 MG capsule Commonly known as: PROZAC  Take 1 capsule (40 mg total) by mouth daily.        Birth History: non-contributory  Developmental History: non-contributory  Past Surgical History: History reviewed. No pertinent surgical history.   Family History: Family History  Problem Relation Age of Onset   Asthma Mother    Healthy Mother    Urticaria Father    Healthy Father    Asthma Sister    Eczema Brother      Social History: [place] lives at home with his family.  They live in a house that is 67+ years old.  There is 1 family areas in town bedroom.  They have electric heating and central cooling.  There are 3 dogs inside of the home.  There are cats outside of the home.  There are no dust mite covers on the bedding.  There is no tobacco exposure.  He is currently in college.  There is no fume, chemical, or dust exposure.   Review of systems otherwise negative other than that mentioned in the HPI.    Objective:   Blood pressure 134/88, pulse 88, temperature 98.1 F (36.7 C), temperature source Temporal, resp. rate 19, height 5' 9.75" (1.772 m), weight 181 lb 11.2 oz (82.4 kg), SpO2 98%. Body mass index is 26.26 kg/m.     Physical Exam Constitutional:      Appearance: He is well-developed.  HENT:     Head: Normocephalic and atraumatic.     Right Ear: Tympanic membrane, ear canal and external ear normal. No drainage, swelling or tenderness. Tympanic membrane is not injected, scarred, erythematous, retracted or bulging.     Left Ear: Tympanic membrane, ear canal and external ear normal. No drainage, swelling or tenderness. Tympanic membrane is not injected, scarred, erythematous, retracted or bulging.      Nose: No nasal deformity, septal deviation, mucosal edema or rhinorrhea.     Right Sinus: No maxillary sinus tenderness or frontal sinus tenderness.     Left Sinus: No maxillary sinus tenderness or frontal sinus tenderness.     Mouth/Throat:     Mouth: Mucous membranes are not pale and not dry.     Pharynx: Uvula midline.  Eyes:     General:        Right eye: No discharge.        Left eye: No discharge.     Conjunctiva/sclera: Conjunctivae normal.     Right eye: Right conjunctiva is not injected. No chemosis.    Left eye: Left conjunctiva is not injected. No chemosis.    Pupils: Pupils are equal, round, and reactive to light.  Cardiovascular:     Rate and Rhythm: Normal rate and regular rhythm.     Heart sounds: Normal heart sounds.  Pulmonary:     Effort: Pulmonary effort is normal. No tachypnea, accessory muscle usage or respiratory distress.     Breath sounds: Normal breath sounds. No wheezing, rhonchi or rales.  Chest:     Chest wall: No tenderness.  Abdominal:     Tenderness: There is no abdominal tenderness. There is no guarding or rebound.  Lymphadenopathy:     Head:     Right side of head: No submandibular, tonsillar or occipital adenopathy.     Left side of head: No submandibular, tonsillar or occipital adenopathy.     Cervical: No cervical adenopathy.  Skin:    Coloration: Skin is not pale.     Findings: No abrasion, erythema, petechiae or rash. Rash is not papular, urticarial or vesicular.  Neurological:     Mental Status: He is alert.      Diagnostic studies:    Spirometry: results normal (FEV1: 4.48/96%, FVC: 5.90/107%, FEV1/FVC: 76%).    Spirometry consistent with normal pattern.     Allergy Studies: deferred due to insurance stipulations that require a separate visit for testing          Drexel Gentles, MD [facility] 

## 2023-07-06 ENCOUNTER — Ambulatory Visit: Admitting: Allergy & Immunology

## 2023-07-06 DIAGNOSIS — T7805XD Anaphylactic reaction due to tree nuts and seeds, subsequent encounter: Secondary | ICD-10-CM

## 2023-07-06 DIAGNOSIS — J302 Other seasonal allergic rhinitis: Secondary | ICD-10-CM

## 2023-07-06 DIAGNOSIS — J3089 Other allergic rhinitis: Secondary | ICD-10-CM | POA: Diagnosis not present

## 2023-07-06 DIAGNOSIS — J301 Allergic rhinitis due to pollen: Secondary | ICD-10-CM | POA: Diagnosis not present

## 2023-07-06 NOTE — Patient Instructions (Addendum)
 1. Anaphylaxis due to tree nut - Testing was negative to the entire tree nut panel. - We rae confirming this with blood work.  - We will be in touch regarding the labs.   2. Seasonal and perennial allergic rhinitis - Testing today showed: grasses, ragweed, weeds, trees, indoor molds, outdoor molds, and cat - Copy of test results provided.  - Avoidance measures provided. - Continue with: Ryaltris one spray per nostril twice daily.  - You can use an extra dose of the antihistamine, if needed, for breakthrough symptoms.  - Consider nasal saline rinses 1-2 times daily to remove allergens from the nasal cavities as well as help with mucous clearance (this is especially helpful to do before the nasal sprays are given) - We will start allergy shots as a means of long-term control. - Allergy shots "re-train" and "reset" the immune system to ignore environmental allergens and decrease the resulting immune response to those allergens (sneezing, itchy watery eyes, runny nose, nasal congestion, etc).    - Allergy shots improve symptoms in 75-85% of patients.  - Consent signed today. - We will get you hooked up with an allergy practice in [place] to give shots once you move.   3. Return in about 7 months (around 02/05/2024). You can have the follow up appointment with Dr. Idolina Maker or a Nurse Practicioner (our Nurse Practitioners are excellent and always have Physician oversight!).    Please inform us  of any Emergency Department visits, hospitalizations, or changes in symptoms. Call us  before going to the ED for breathing or allergy symptoms since we might be able to fit you in for a sick visit. Feel free to contact us  anytime with any questions, problems, or concerns.  It was a pleasure to see you again today!  Websites that have reliable patient information: 1. American Academy of Asthma, Allergy, and Immunology: www.aaaai.org 2. Food Allergy Research and Education (FARE): foodallergy.org 3.  Mothers of Asthmatics: http://www.asthmacommunitynetwork.org 4. American College of Allergy, Asthma, and Immunology: www.acaai.org      "Like" us  on Facebook and Instagram for our latest updates!      A healthy democracy works best when [employer] participate! Make sure you are registered to vote! If you have moved or changed any of your contact information, you will need to get this updated before voting! Scan the QR codes below to learn more!

## 2023-07-06 NOTE — Progress Notes (Unsigned)
 FOLLOW UP  Date of Service/Encounter:  07/06/23   Assessment:   Perennial and seasonal allergic rhinitis (grasses, ragweed, weeds, trees, indoor molds, outdoor molds, and cat)   Anaphylaxis due to tree nut   Chronic cough - normal spirometry and has never needed albuterol   History of bulmia     Plan/Recommendations:   1. Anaphylaxis due to tree nut - Testing was negative to the entire tree nut panel. - We rae confirming this with blood work.  - We will be in touch regarding the labs.   2. Seasonal and perennial allergic rhinitis - Testing today showed: grasses, ragweed, weeds, trees, indoor molds, outdoor molds, and cat - Copy of test results provided.  - Avoidance measures provided. - Continue with: Ryaltris one spray per nostril twice daily.  - You can use an extra dose of the antihistamine, if needed, for breakthrough symptoms.  - Consider nasal saline rinses 1-2 times daily to remove allergens from the nasal cavities as well as help with mucous clearance (this is especially helpful to do before the nasal sprays are given) - We will start allergy shots as a means of long-term control. - Allergy shots "re-train" and "reset" the immune system to ignore environmental allergens and decrease the resulting immune response to those allergens (sneezing, itchy watery eyes, runny nose, nasal congestion, etc).    - Allergy shots improve symptoms in 75-85% of patients.  - Consent signed today. - We will get you hooked up with an allergy practice in [place] to give shots once you move.   3. Return in about 7 months (around 02/05/2024). You can have the follow up appointment with Dr. Idolina Maker or a Nurse Practicioner (our Nurse Practitioners are excellent and always have Physician oversight!).    Subjective:   Charles Rios is a 23 y.o. male presenting today for follow up of No chief complaint on file.   Charles Rios has a history of the following: Patient Active  Problem List   Diagnosis Date Noted   Severe tetrahydrocannabinol (THC) dependence (HCC) 12/11/2020   Single episode of elevated blood pressure 12/11/2020   Viral syndrome 12/08/2020   Injury of knee, right 09/09/2020   Bilateral distal clavicular osteolysis 01/03/2020   Suspected COVID-19 virus infection 11/08/2019   Benign nevus 12/16/2018   Annual physical exam 12/16/2018   Proteinuria 10/23/2016   Abdominal pain in child 10/22/2016   Non-intractable cyclical vomiting with nausea 12/24/2015   Generalized anxiety disorder 12/24/2015   Central auditory processing disorder 01/02/2014   Poor concentration 07/11/2013   Seasonal allergies 12/02/2012    History obtained from: chart review and patient.  Discussed the use of AI scribe software for clinical note transcription with the patient and/or guardian, who gave verbal consent to proceed.  Sem is a 23 y.o. male presenting for skin testing. He was last seen on May 12th. We could not do testing because his insurance company does not cover testing on the same day as a New Patient visit. He has been off of all antihistamines 3 days in anticipation of the testing.   Otherwise, there have been no changes to his past medical history, surgical history, family history, or social history.    Review of systems otherwise negative other than that mentioned in the HPI.    Objective:   There were no vitals taken for this visit. There is no height or weight on file to calculate BMI.    Physical exam deferred since this was a skin testing  appointment only.   Diagnostic studies:   Allergy Studies:     Airborne Adult Perc - 07/06/23 1712     Time Antigen Placed 1712    Allergen Manufacturer Floyd Hutchinson    Location Back    Number of Test 55    Panel 1 Select    1. Control-Buffer 50% Glycerol Negative    2. Control-Histamine 2+    3. [place]ia 4+    4. [place] 4+    5. Johnson 4+    6. [place]  Blue 4+    7. Meadow Fescue 4+    8.  Perennial Rye 4+    9. Timothy 4+    10. Ragweed Mix 2+    11. Cocklebur 2+    12. Plantain,  English 3+    13. Baccharis 3+    14. Dog Fennel Negative    15. Russian Thistle 3+    16. Lamb's Quarters Negative    17. Sheep Sorrell Negative    18. Rough Pigweed Negative    19. Marsh Elder, Rough Negative    20. Mugwort, Common Negative    21. Box, Elder Negative    22. Cedar, red Negative    23. Sweet Gum 2+    24. Pecan Pollen 4+    25. Pine Mix Negative    26. Walnut, Black Pollen Negative    27. Red Mulberry Negative    28. Ash Mix Negative    29. Birch Mix Negative    30. Beech American Negative    31. Cottonwood, [place] Negative    32. Hickory, White 3+    33. Maple Mix 2+    34. Oak, [place] Mix 2+    35. Sycamore Eastern Negative    36. Alternaria Alternata Negative    37. Cladosporium Herbarum Negative    38. Aspergillus Mix Negative    39. Penicillium Mix Negative    40. Bipolaris Sorokiniana (Helminthosporium) Negative    41. Drechslera Spicifera (Curvularia) Negative    42. Mucor Plumbeus Negative    43. Fusarium Moniliforme Negative    44. Aureobasidium Pullulans (pullulara) Negative    45. Rhizopus Oryzae Negative    46. Botrytis Cinera Negative    47. Epicoccum Nigrum Negative    48. Phoma Betae Negative    49. Dust Mite Mix Negative    50. Cat Hair 10,000 BAU/ml Negative    51.  Dog Epithelia Negative    52. Mixed Feathers Negative    53. Horse Epithelia Negative    54. Cockroach, German Negative    55. Tobacco Leaf Negative             Intradermal - 07/06/23 1734     Time Antigen Placed 0545    Allergen Manufacturer Floyd Hutchinson    Location Arm    Number of Test 9    Intradermal Select    Control Negative    Mold 1 2+    Mold 2 2+    Mold 3 Negative    Mold 4 Negative    Mite Mix Negative    Cat 2+    Dog Negative    Cockroach Negative             Food Adult Perc - 07/06/23 1700     Time Antigen Placed 1712    Allergen  Manufacturer Floyd Hutchinson    Location Back    Number of allergen test 8    10. Cashew Negative    11. [facility] Food Negative  12. Almond Negative    13. Hazelnut Negative    14. Pecan Food Negative    15. Pistachio Negative    16. [place] Nut Negative    17. Coconut Negative             Allergy testing results were read and interpreted by myself, documented by clinical staff.      Drexel Gentles, MD  [facility] 

## 2023-07-08 ENCOUNTER — Encounter: Payer: Self-pay | Admitting: Allergy & Immunology

## 2023-07-08 LAB — IGE NUT PROF. W/COMPONENT RFLX

## 2023-07-10 LAB — PEANUT COMPONENTS
F352-IgE Ara h 8: <0.1 kU/L
F422-IgE Ara h 1: <0.1 kU/L
F423-IgE Ara h 2: <0.1 kU/L
F424-IgE Ara h 3: <0.1 kU/L
F427-IgE Ara h 9: <0.1 kU/L
F447-IgE Ara h 6: <0.1 kU/L

## 2023-07-10 LAB — IGE NUT PROF. W/COMPONENT RFLX
F017-IgE Hazelnut (Filbert): 0.2 kU/L — AB
F018-IgE Brazil Nut: <0.1 kU/L
F020-IgE Almond: 0.19 kU/L — AB
F202-IgE Cashew Nut: <0.1 kU/L
F203-IgE Pistachio Nut: 0.26 kU/L — AB
F256-IgE Walnut: 0.17 kU/L — AB
Macadamia Nut, IgE: 0.25 kU/L — AB
Peanut, IgE: 0.34 kU/L — AB
Pecan Nut IgE: <0.1 kU/L

## 2023-07-10 LAB — PANEL 604726
Cor A 1 IgE: <0.1 kU/L
Cor A 14 IgE: <0.1 kU/L
Cor A 8 IgE: <0.1 kU/L
Cor A 9 IgE: <0.1 kU/L

## 2023-07-10 LAB — PANEL 604721
Jug R 1 IgE: <0.1 kU/L
Jug R 3 IgE: <0.1 kU/L

## 2023-07-10 LAB — ALLERGEN COMPONENT COMMENTS

## 2023-07-13 ENCOUNTER — Encounter: Payer: Self-pay | Admitting: Family Medicine

## 2023-07-13 ENCOUNTER — Ambulatory Visit: Payer: Self-pay | Admitting: Allergy & Immunology

## 2023-07-18 ENCOUNTER — Telehealth: Admitting: Nurse Practitioner

## 2023-07-18 DIAGNOSIS — R21 Rash and other nonspecific skin eruption: Secondary | ICD-10-CM | POA: Diagnosis not present

## 2023-07-18 MED ORDER — PREDNISONE 20 MG PO TABS: 20.0000 mg | ORAL_TABLET | Freq: Every day | ORAL | 0 refills | Status: AC

## 2023-07-18 MED ORDER — HYDROCORTISONE 2.5 % EX CREA: TOPICAL_CREAM | Freq: Two times a day (BID) | CUTANEOUS | 0 refills | Status: AC

## 2023-07-18 NOTE — Progress Notes (Signed)
 I have spent 5 minutes in review of e-visit questionnaire, review and updating patient chart, medical decision making and response to patient.   Claiborne Rigg, NP

## 2023-07-18 NOTE — Progress Notes (Signed)
 E-Visit for [employer]   We are sorry that you are not feeing well.  Here is how we plan to help!   Based on what you have shared with me it looks like you have had an allergic reaction to the oily resin from a group of plants.  This resin is very sticky, so it easily attaches to your skin, clothing, tools equipment, and pet's fur.     This blistering rash is often called poison ivy rash although it can come from contact with the leaves, stems and roots of poison ivy, poison oak and poison sumac.   The oily resin contains urushiol (u-ROO-she-ol) that produces a skin rash on exposed skin.  The severity of the rash depends on the amount of urushiol that gets on your skin.  A section of skin with more urushiol on it may develop a rash sooner.   The rash usually develops 12-48 hours after exposure and can last two to three weeks.  Your skin must come in direct contact with the plant's oil to be affected.  Blister fluid doesn't spread the rash.  However, if you come into contact with a piece of clothing or pet fur that has urushiol on it, the rash may spread out.  You can also transfer the oil to other parts of your body with your fingers.   Often the rash looks like a straight line because of the way the plant brushes against your skin.  Most poison ivy treatments are usually limited to self-care methods. Small areas of the rash will typically go away on its own in two to three weeks.  Since your rash is limited, I am recommending that you follow these recommendations:  Make sure that the clothes you were wearing and any towels or sheets that may have come in contact with the oil (urushiol) are washed in detergent and hot water.  Apply Benadryl or Caladryl lotion to the rash.  You may apply these as often as needed to control the itching.  Cool baths also often help with itching.  You may also apply an over-the-counter corticosteroid cream for the first few days.  Take oral antihistamines, such as  diphenhydramine (Benadryl, others), which may also help you sleep better.  Soak in a cool-water bath containing an oatmeal-based bath product (Aveeno).  Place Cool, wet compresses on the affected area for 15 to 30 minutes several times a day.  Avoid a hot shower or bath as this may increase your itching.      I have sent prescription strength hydrocortisone cream and a few days of prednisone      What can you do to prevent this rash?   Avoid the plants.  Learn how to identify poison ivy, poison oak and poison sumac in all seasons.  When hiking or engaging in other activities that might expose you to these plants, try to stay on cleared pathways.  If camping, make sure you pitch your tent in an area free of these plants.  Keep pets from running through wooded areas so that urushiol doesn't accidentally stick to their fur, which you may touch.   Remove or kill the plants.  In your yard, you can get rid of poison ivy by applying an herbicide or pulling it out of the ground, including the roots, while wearing heavy gloves.  Afterward remove the gloves and thoroughly wash them and your hands.  Don't burn poison ivy or related plants because the urushiol can be carried by smoke.  Wear protective clothing.  If needed, protect your skin by wearing socks, boots, pants, long sleeves and vinyl gloves.   Wash your skin right away.  Washing off the oil with soap and water within 30 minutes of exposure may reduce your chances of getting a poison ivy rash.  Even washing after an hour or so can help reduce the severity of the rash.   If you walk through some poison ivy and then later touch your shoes, you may get some urushiol on your hands, which may then transfer to your face or body by touching or rubbing.  If the contaminated object isn't cleaned, the urushiol on it can still cause a skin reaction years later.       Be careful not to reuse towels after you have washed your skin.  Also carefully wash clothing  in detergent and hot water to remove all traces of the oil.  Handle contaminated clothing carefully so you don't transfer the urushiol to yourself, furniture, rugs or appliances.   Remember that pets can carry the oil on their fur and paws.  If you think your pet may be contaminated with urushiol, put on some long rubber gloves and give your pet a bath.   Finally, be careful not to burn these plants as the smoke can contain traces of the oil.  Inhaling the smoke may result in difficulty breathing. If that occurred you should see a physician as soon as possible.   See your doctor right away if:   The reaction is severe or widespread You inhaled the smoke from burning poison ivy and are having difficulty breathing Your skin continues to swell The rash affects your eyes, mouth or genitals Blisters are oozing pus You develop a fever greater than 100 F (37.8 C) The rash doesn't get better within a few weeks.   If you scratch the poison ivy rash, bacteria under your fingernails may cause the skin to become infected.  See your doctor if pus starts oozing from the blisters.  Treatment generally includes antibiotics.   Poison ivy treatments are usually limited to self-care methods.  And the rash typically goes away on its own in two to three weeks.      If the rash is widespread or results in a large number of blisters, your doctor may prescribe an oral corticosteroid, such as prednisone .  If a bacterial infection has developed at the rash site, your doctor may give you a prescription for an oral antibiotic.   MAKE SURE YOU   Understand these instructions. Will watch your condition. Will get help right away if you are not doing well or get worse.     Thank you for choosing an e-visit.   Your e-visit answers were reviewed by a board certified advanced clinical practitioner to complete your personal care plan. Depending upon the condition, your plan could have included both over the counter or  prescription medications.   Please review your pharmacy choice. Make sure the pharmacy is open so you can pick up prescription now. If there is a problem, you may contact your provider through [employer] and have the prescription routed to another pharmacy.  Your safety is important to us . If you have drug allergies check your prescription carefully.    For the next 24 hours you can use MyChart to ask questions about today's visit, request a non-urgent call back, or ask for a work or school excuse. You will get an email in the next two  days asking about your experience. I hope that your e-visit has been valuable and will speed your recovery.

## 2023-09-01 DIAGNOSIS — F901 Attention-deficit hyperactivity disorder, predominantly hyperactive type: Secondary | ICD-10-CM | POA: Diagnosis not present

## 2023-09-24 DIAGNOSIS — F4322 Adjustment disorder with anxiety: Secondary | ICD-10-CM | POA: Diagnosis not present

## 2023-10-01 ENCOUNTER — Encounter: Payer: Self-pay | Admitting: Family Medicine

## 2023-10-12 ENCOUNTER — Encounter: Payer: Self-pay | Admitting: Sports Medicine

## 2023-12-30 ENCOUNTER — Encounter (INDEPENDENT_AMBULATORY_CARE_PROVIDER_SITE_OTHER): Payer: Self-pay | Admitting: Family Medicine

## 2023-12-30 DIAGNOSIS — B001 Herpesviral vesicular dermatitis: Secondary | ICD-10-CM

## 2023-12-31 MED ORDER — VALACYCLOVIR HCL 1 G PO TABS: ORAL_TABLET | ORAL | 0 refills | Status: AC

## 2023-12-31 NOTE — Telephone Encounter (Signed)

## 2024-02-06 ENCOUNTER — Encounter

## 2024-02-17 ENCOUNTER — Encounter: Payer: Self-pay | Admitting: Family Medicine

## 2024-02-18 NOTE — Telephone Encounter (Signed)
 That would be totally fine with me but it sounds like he will not be here except for February 6 because he lives in [place] for school?

## 2024-03-08 ENCOUNTER — Telehealth: Payer: Self-pay | Admitting: Family Medicine

## 2024-03-08 NOTE — Telephone Encounter (Signed)
 Copied from CRM [id number]. Topic: Clinical - Request for Lab/Test Order >> Mar 08, 2024  4:04 PM Wess RAMAN wrote: Reason for CRM: Patient Is requesting labs orders placed. He stated he was bulimic and just want to get back on track  Callback #: [id number]

## 2024-03-09 NOTE — Telephone Encounter (Signed)
LMTCB to schedule appt with PCP
